# Patient Record
Sex: Female | Born: 1975 | Race: Black or African American | Hispanic: No | Marital: Single | Smoking: Former smoker
Health system: Southern US, Community
[De-identification: ages and names within clinical notes are randomized; demographics above are authoritative.]

## PROBLEM LIST (undated history)

## (undated) DIAGNOSIS — F32A Depression, unspecified: Secondary | ICD-10-CM

## (undated) DIAGNOSIS — I10 Essential (primary) hypertension: Secondary | ICD-10-CM

## (undated) DIAGNOSIS — E119 Type 2 diabetes mellitus without complications: Secondary | ICD-10-CM

## (undated) HISTORY — PX: BREAST ENHANCEMENT SURGERY: SHX7

## (undated) HISTORY — DX: Type 2 diabetes mellitus without complications: E11.9

## (undated) HISTORY — DX: Essential (primary) hypertension: I10

## (undated) HISTORY — PX: TUBAL LIGATION: SHX77

## (undated) HISTORY — DX: Depression, unspecified: F32.A

---

## 2020-01-29 ENCOUNTER — Ambulatory Visit (INDEPENDENT_AMBULATORY_CARE_PROVIDER_SITE_OTHER): Payer: No Typology Code available for payment source | Admitting: Obstetrics & Gynecology

## 2020-01-29 ENCOUNTER — Other Ambulatory Visit: Payer: Self-pay

## 2020-01-29 ENCOUNTER — Encounter: Payer: Self-pay | Admitting: *Deleted

## 2020-01-29 ENCOUNTER — Encounter: Payer: Self-pay | Admitting: Obstetrics & Gynecology

## 2020-01-29 ENCOUNTER — Other Ambulatory Visit (HOSPITAL_COMMUNITY)
Admission: RE | Admit: 2020-01-29 | Discharge: 2020-01-29 | Disposition: A | Discharge status: Home / Self Care | Payer: PRIVATE HEALTH INSURANCE | Source: Ambulatory Visit | Attending: Obstetrics & Gynecology | Admitting: Obstetrics & Gynecology

## 2020-01-29 DIAGNOSIS — N921 Excessive and frequent menstruation with irregular cycle: Secondary | ICD-10-CM

## 2020-01-29 DIAGNOSIS — D219 Benign neoplasm of connective and other soft tissue, unspecified: Secondary | ICD-10-CM

## 2020-01-29 LAB — POCT PREGNANCY, URINE: Preg Test, Ur: NEGATIVE

## 2020-01-29 NOTE — Addendum Note (Signed)
Addended by: Samuel Germany on: 01/29/2020 04:24 PM   Modules accepted: Orders

## 2020-01-29 NOTE — Progress Notes (Signed)
Colleen Lane is an 44 y.o. female. G1P1 who presents for heavy vaginal bleeding worsened over the past 3-4 years.  Pt now bleeding 4-5 days, using 12-15 pads/day. Passing clots.  Has declined medical management thus far, she had imaging done which noted fibroids, but unsure of size and location.   Pertinent Gynecological History: Menses: flow is excessive with use of 12 pads or tampons on heaviest days, irregular occurring approximately every 10 days without intermenstrual spotting and with severe dysmenorrhea Bleeding: dysfunctional uterine bleeding Contraception: tubal ligation DES exposure: denies Blood transfusions: none Sexually transmitted diseases: no past history Previous GYN Procedures: none  Last mammogram: normal Date: 2019 Last pap: normal Date: 2019 OB History: G1, P1001   Menstrual History: Menarche age: 63 Patient's last menstrual period was 01/13/2020 (within days).    Past Medical History:  Diagnosis Date  . Depression   . Diabetes mellitus without complication ([place])   . Hypertension     Past Surgical History:  Procedure Laterality Date  . BREAST ENHANCEMENT SURGERY    . TUBAL LIGATION      Family History  Problem Relation Age of Onset  . Hypertension Father   . Heart attack Father   . Diabetes Mother   . Hypertension Mother   . High Cholesterol Mother     Social History:  reports that she quit smoking about 17 years ago. Her smoking use included cigarettes. She has never used smokeless tobacco. She reports current alcohol use. She reports that she does not use drugs.  Allergies:  Allergies  Allergen Reactions  . Doxycycline Rash  . Sulfamethoxazole-Trimethoprim Rash    (Not in a hospital admission)   Review of Systems  Constitutional: Positive for fatigue. Negative for activity change, appetite change and unexpected weight change.  HENT: Negative.   Eyes: Negative.   Respiratory: Negative.  Negative for chest tightness, shortness of breath  and wheezing.   Cardiovascular: Negative.  Negative for chest pain and leg swelling.  Gastrointestinal: Negative.  Negative for abdominal distention, abdominal pain, constipation, diarrhea, nausea and vomiting.  Endocrine: Negative.   Genitourinary: Positive for menstrual problem and pelvic pain. Negative for dysuria and hematuria.  Neurological: Negative.  Negative for dizziness, weakness, light-headedness, numbness and headaches.  Hematological: Negative.   Psychiatric/Behavioral: Negative.  Negative for agitation, confusion and decreased concentration.    Blood pressure 127/77, pulse (!) 102, height 6' (1.829 m), weight 241 lb (109.3 kg), last menstrual period 01/13/2020. Physical Exam Vitals reviewed.  Constitutional:      Appearance: She is well-developed.  HENT:     Head: Normocephalic and atraumatic.  Eyes:     Pupils: Pupils are equal, round, and reactive to light.  Cardiovascular:     Rate and Rhythm: Normal rate.  Pulmonary:     Effort: Pulmonary effort is normal.  Abdominal:     General: There is no distension.     Palpations: Abdomen is soft.     Tenderness: There is no abdominal tenderness. There is no guarding.  Genitourinary:    General: Normal vulva.  Musculoskeletal:     Cervical back: Normal range of motion.  Skin:    General: Skin is warm and dry.  Neurological:     Mental Status: She is alert and oriented to person, place, and time.  Psychiatric:        Behavior: Behavior normal.     Results for orders placed or performed in visit on 01/29/20 (from the past 24 hour(s))  Pregnancy, urine POC  Status: None   Collection Time: 01/29/20  2:05 PM  Result Value Ref Range   Preg Test, Ur NEGATIVE NEGATIVE   ENDOMETRIAL BIOPSY     The indications for endometrial biopsy were reviewed.   Risks of the biopsy including cramping, bleeding, infection, uterine perforation, inadequate specimen and need for additional procedures  were discussed. The patient states  she understands and agrees to undergo procedure today. Consent was signed. Time out was performed. Urine HCG was negative. During the pelvic exam, the cervix was prepped with Betadine. A single-toothed tenaculum was placed on the anterior lip of the cervix to stabilize it. The 3 mm pipelle was introduced into the endometrial cavity without difficulty to a depth of 8.5 cm, and a moderate amount of tissue was obtained and sent to pathology. The instruments were removed from the patient's vagina. Minimal bleeding from the cervix was noted. The patient tolerated the procedure well. Routine post-procedure instructions were given to the patient.     Assessment/Plan: 44yo G1P1 presents with chronic menorrhagia with irregular bleeding. We proceeded with EMB today, pelvic U/S ordered to assess for potential fibroids. No prior imaging was noted on chart review.  Discussed starting provera or another hormonal regimen to decrease bleeding.  Pt desires the results before starting on hormone therapy.  Precautions given, follow up after results, prn concerns.   [name] 01/29/2020, 2:59 PM

## 2020-01-29 NOTE — Patient Instructions (Signed)
Endometrial Biopsy  Endometrial biopsy is a procedure in which a tissue sample is taken from inside the uterus. The sample is taken from the endometrium, which is the lining of the uterus. The tissue sample is then checked under a microscope to see if the tissue is normal or abnormal. This procedure helps to determine where you are in your menstrual cycle and how hormone levels are affecting the lining of the uterus. This procedure may also be used to evaluate uterine bleeding or to diagnose endometrial cancer, endometrial tuberculosis, polyps, or other inflammatory conditions. Tell a health care provider about:  Any allergies you have.  All medicines you are taking, including vitamins, herbs, eye drops, creams, and over-the-counter medicines.  Any problems you or family members have had with anesthetic medicines.  Any blood disorders you have.  Any surgeries you have had.  Any medical conditions you have.  Whether you are pregnant or may be pregnant. What are the risks? Generally, this is a safe procedure. However, problems may occur, including:  Bleeding.  Pelvic infection.  Puncture of the wall of the uterus with the biopsy device (rare). What happens before the procedure?  Keep a record of your menstrual cycles as told by your health care provider. You may need to schedule your procedure for a specific time in your cycle.  You may want to bring a sanitary pad to wear after the procedure.  Ask your health care provider about: ? Changing or stopping your regular medicines. This is especially important if you are taking diabetes medicines or blood thinners. ? Taking medicines such as aspirin and ibuprofen. These medicines can thin your blood. Do not take these medicines before your procedure if your health care provider instructs you not to.  Plan to have someone take you home from the hospital or clinic. What happens during the procedure?  To lower your risk of  infection: ? Your health care team will wash or sanitize their hands.  You will lie on an exam table with your feet and legs supported as in a pelvic exam.  Your health care provider will insert an instrument (speculum) into your vagina to see your cervix.  Your cervix will be cleansed with an antiseptic solution.  A medicine (local anesthetic) will be used to numb the cervix.  A forceps instrument (tenaculum) will be used to hold your cervix steady for the biopsy.  A thin, rod-like instrument (uterine sound) will be inserted through your cervix to determine the length of your uterus and the location where the biopsy sample will be removed.  A thin, flexible tube (catheter) will be inserted through your cervix and into the uterus. The catheter will be used to collect the biopsy sample from your endometrial tissue.  The catheter and speculum will then be removed, and the tissue sample will be sent to a lab for examination. What happens after the procedure?  You will rest in a recovery area until you are ready to go home.  You may have mild cramping and a small amount of vaginal bleeding. This is normal.  It is up to you to get the results of your procedure. Ask your health care provider, or the department that is doing the procedure, when your results will be ready. Summary  Endometrial biopsy is a procedure in which a tissue sample is taken from the endometrium, which is the lining of the uterus.  This procedure may help to diagnose menstrual cycle problems, abnormal bleeding, or other conditions affecting   the endometrium.  Before the procedure, keep a record of your menstrual cycles as told by your health care provider.  The tissue sample that is removed will be checked under a microscope to see if it is normal or abnormal. This information is not intended to replace advice given to you by your health care provider. Make sure you discuss any questions you have with your health care  provider. Document Revised: 05/19/2017 Document Reviewed: 06/22/2016 Elsevier Patient Education  2020 [employer] Inc[employer]  

## 2020-01-31 LAB — SURGICAL PATHOLOGY

## 2020-02-04 ENCOUNTER — Ambulatory Visit
Admission: RE | Admit: 2020-02-04 | Discharge: 2020-02-04 | Disposition: A | Discharge status: Home / Self Care | Payer: PRIVATE HEALTH INSURANCE | Source: Ambulatory Visit | Attending: Obstetrics & Gynecology | Admitting: Obstetrics & Gynecology

## 2020-02-04 ENCOUNTER — Other Ambulatory Visit: Payer: Self-pay

## 2020-02-04 ENCOUNTER — Other Ambulatory Visit: Payer: Self-pay | Admitting: Obstetrics & Gynecology

## 2020-02-04 DIAGNOSIS — D219 Benign neoplasm of connective and other soft tissue, unspecified: Secondary | ICD-10-CM | POA: Insufficient documentation

## 2020-02-04 DIAGNOSIS — N921 Excessive and frequent menstruation with irregular cycle: Secondary | ICD-10-CM

## 2020-02-17 ENCOUNTER — Telehealth (INDEPENDENT_AMBULATORY_CARE_PROVIDER_SITE_OTHER): Payer: No Typology Code available for payment source

## 2020-02-17 DIAGNOSIS — N921 Excessive and frequent menstruation with irregular cycle: Secondary | ICD-10-CM

## 2020-02-17 NOTE — Telephone Encounter (Addendum)
-----   Message from [name], MD sent at 02/14/2020  2:30 AM EDT ----- EMB neg, U/S noted multiple fibroids some submucosal.  Okay to start hormonal therapy.  We can also discuss Ablation vs hysterectomy.    Called pt; negative endometrial biopsy result given. [place] results given. Explained that pt can talk in detail with provider at appt on 02/27/20. Pt is still interested in surgery but is concerned about how COVID may affect her options. Encouraged pt to follow up with office as needed prior to appt.

## 2020-02-20 ENCOUNTER — Ambulatory Visit: Payer: No Typology Code available for payment source

## 2020-02-27 ENCOUNTER — Ambulatory Visit (INDEPENDENT_AMBULATORY_CARE_PROVIDER_SITE_OTHER): Payer: No Typology Code available for payment source | Admitting: Obstetrics & Gynecology

## 2020-02-27 ENCOUNTER — Other Ambulatory Visit: Payer: Self-pay

## 2020-02-27 ENCOUNTER — Encounter: Payer: Self-pay | Admitting: Obstetrics & Gynecology

## 2020-02-27 VITALS — BP 139/83 | HR 79 | Wt 217.0 lb

## 2020-02-27 DIAGNOSIS — D219 Benign neoplasm of connective and other soft tissue, unspecified: Secondary | ICD-10-CM

## 2020-02-27 DIAGNOSIS — N921 Excessive and frequent menstruation with irregular cycle: Secondary | ICD-10-CM | POA: Diagnosis not present

## 2020-02-27 NOTE — Progress Notes (Signed)
Colleen Lane is an 44 y.o. female. G1P1 who presents for U/S result follow up. Pt still with monthly heavy cycles. After discussion on risks and benefits to hormonal therapy, declines hormones.  Pt interested in novasure ablation vs hysterectomy   Pertinent Gynecological History: Menses: regular every 5-7 days without intermenstrual spotting Bleeding: dysfunctional uterine bleeding Contraception: tubal ligation DES exposure: denies Blood transfusions: none Sexually transmitted diseases: no past history OB History: G1, P1001   Menstrual History: Menarche age: 15 No LMP recorded.    Past Medical History:  Diagnosis Date  . Depression   . Diabetes mellitus without complication ([place])   . Hypertension     Past Surgical History:  Procedure Laterality Date  . BREAST ENHANCEMENT SURGERY    . TUBAL LIGATION      Family History  Problem Relation Age of Onset  . Hypertension Father   . Heart attack Father   . Diabetes Mother   . Hypertension Mother   . High Cholesterol Mother     Social History:  reports that she quit smoking about 17 years ago. Her smoking use included cigarettes. She has never used smokeless tobacco. She reports current alcohol use. She reports that she does not use drugs.  Allergies:  Allergies  Allergen Reactions  . Doxycycline Rash  . Sulfamethoxazole-Trimethoprim Rash    (Not in a hospital admission)   Review of Systems  Constitutional: Negative for activity change, appetite change, fatigue and unexpected weight change.  HENT: Negative.   Eyes: Negative.   Respiratory: Negative.  Negative for chest tightness, shortness of breath and wheezing.   Cardiovascular: Negative.  Negative for chest pain and leg swelling.  Gastrointestinal: Negative.  Negative for abdominal distention, abdominal pain, constipation, diarrhea, nausea and vomiting.  Endocrine: Negative.   Genitourinary: Negative.  Negative for dysuria and hematuria.  Neurological:  Negative.  Negative for dizziness, weakness, light-headedness, numbness and headaches.  Hematological: Negative.   Psychiatric/Behavioral: Negative.  Negative for agitation, confusion and decreased concentration.    Blood pressure 139/83, pulse 79, weight 217 lb (98.4 kg). Physical Exam Vitals reviewed.  Constitutional:      Appearance: She is well-developed.  HENT:     Head: Normocephalic and atraumatic.  Eyes:     Pupils: Pupils are equal, round, and reactive to light.  Neck:     Vascular: No carotid bruit.  Cardiovascular:     Rate and Rhythm: Normal rate.  Pulmonary:     Effort: Pulmonary effort is normal.  Abdominal:     General: There is no distension.     Palpations: Abdomen is soft.     Tenderness: There is no abdominal tenderness. There is no guarding.  Musculoskeletal:     Cervical back: Normal range of motion.  Skin:    General: Skin is warm and dry.  Neurological:     Mental Status: She is alert and oriented to person, place, and time.  Psychiatric:        Behavior: Behavior normal.     No results found for this or any previous visit (from the past 24 hour(s)).  No results found.  Assessment/Plan: 44yo G1P1 who presents s/p TVUSG for chronic menorrhagia and dysmenorrhea. U/S noted fibroids, not invading into uterine cavity. Pt declines hormonal management. Pt interested in Novasure endometrial ablation, information given. Pt to make preop appt if patient intersted.   [name] 02/27/2020, 1:44 PM

## 2020-02-27 NOTE — Patient Instructions (Signed)
Endometrial Ablation  Endometrial ablation is a procedure that destroys the thin inner layer of the lining of the uterus (endometrium). This procedure may be done:  To stop heavy periods.  To stop bleeding that is causing anemia.  To control irregular bleeding.  To treat bleeding caused by small tumors (fibroids) in the endometrium.  This procedure is often an alternative to major surgery, such as removal of the uterus and cervix (hysterectomy). As a result of this procedure:  You may not be able to have children. However, if you are premenopausal (you have not gone through menopause):  You may still have a small chance of getting pregnant.  You will need to use a reliable method of birth control after the procedure to prevent pregnancy.  You may stop having a menstrual period, or you may have only a small amount of bleeding during your period. Menstruation may return several years after the procedure.  Tell a health care provider about:  Any allergies you have.  All medicines you are taking, including vitamins, herbs, eye drops, creams, and over-the-counter medicines.  Any problems you or family members have had with the use of anesthetic medicines.  Any blood disorders you have.  Any surgeries you have had.  Any medical conditions you have.  What are the risks?  Generally, this is a safe procedure. However, problems may occur, including:  A hole (perforation) in the uterus or bowel.  Infection of the uterus, bladder, or vagina.  Bleeding.  Damage to other structures or organs.  An air bubble in the lung (air embolus).  Problems with pregnancy after the procedure.  Failure of the procedure.  Decreased ability to diagnose cancer in the endometrium.  What happens before the procedure?  You will have tests of your endometrium to make sure there are no pre-cancerous cells or cancer cells present.  You may have an ultrasound of the uterus.  You may be given medicines to thin the endometrium.  Ask your health care  provider about:  Changing or stopping your regular medicines. This is especially important if you take diabetes medicines or blood thinners.  Taking medicines such as aspirin and ibuprofen. These medicines can thin your blood. Do not take these medicines before your procedure if your doctor tells you not to.  Plan to have someone take you home from the hospital or clinic.  What happens during the procedure?    You will lie on an exam table with your feet and legs supported as in a pelvic exam.  To lower your risk of infection:  Your health care team will wash or sanitize their hands and put on germ-free (sterile) gloves.  Your genital area will be washed with soap.  An IV tube will be inserted into one of your veins.  You will be given a medicine to help you relax (sedative).  A surgical instrument with a light and camera (resectoscope) will be inserted into your vagina and moved into your uterus. This allows your surgeon to see inside your uterus.  Endometrial tissue will be removed using one of the following methods:  Radiofrequency. This method uses a radiofrequency-alternating electric current to remove the endometrium.  Cryotherapy. This method uses extreme cold to freeze the endometrium.  Heated-free liquid. This method uses a heated saltwater (saline) solution to remove the endometrium.  Microwave. This method uses high-energy microwaves to heat up the endometrium and remove it.  Thermal balloon. This method involves inserting a catheter with a balloon tip into   the uterus. The balloon tip is filled with heated fluid to remove the endometrium.  The procedure may vary among health care providers and hospitals.  What happens after the procedure?  Your blood pressure, heart rate, breathing rate, and blood oxygen level will be monitored until the medicines you were given have worn off.  As tissue healing occurs, you may notice vaginal bleeding for 4-6 weeks after the procedure. You may also  experience:  Cramps.  Thin, watery vaginal discharge that is light pink or brown in color.  A need to urinate more frequently than usual.  Nausea.  Do not drive for 24 hours if you were given a sedative.  Do not have sex or insert anything into your vagina until your health care provider approves.  Summary  Endometrial ablation is done to treat the many causes of heavy menstrual bleeding.  The procedure may be done only after medications have been tried to control the bleeding.  Plan to have someone take you home from the hospital or clinic.  This information is not intended to replace advice given to you by your health care provider. Make sure you discuss any questions you have with your health care provider.  Document Revised: 11/21/2017 Document Reviewed: 06/23/2016  Elsevier Patient Education  2020 [employer] Inc[employer]

## 2021-11-21 IMAGING — US US PELVIS COMPLETE WITH TRANSVAGINAL
1 series · 15 of 25 positions shown · non-contrast
Comparison: None

CLINICAL DATA: Menorrhagia for 3-4 years, history of uterine
fibroids, LMP 01/13/2020; history hypertension, diabetes mellitus,
obesity



[Series 1: us pelvis complete with transvaginal · 15 of 82 slices shown]
[im 1/82]
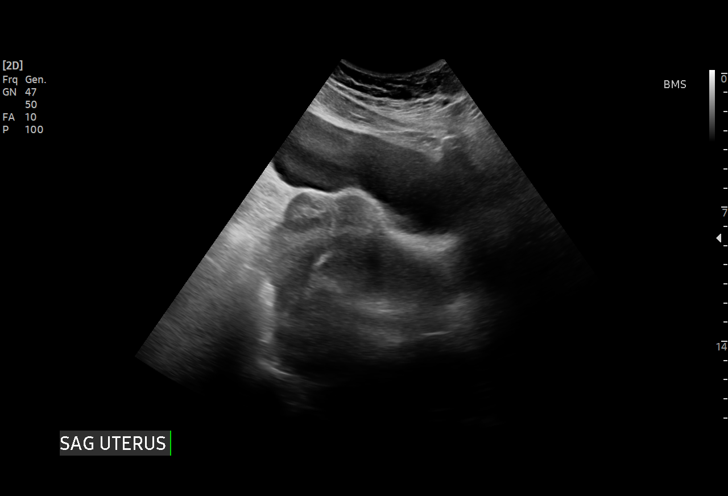
[im 7/82]
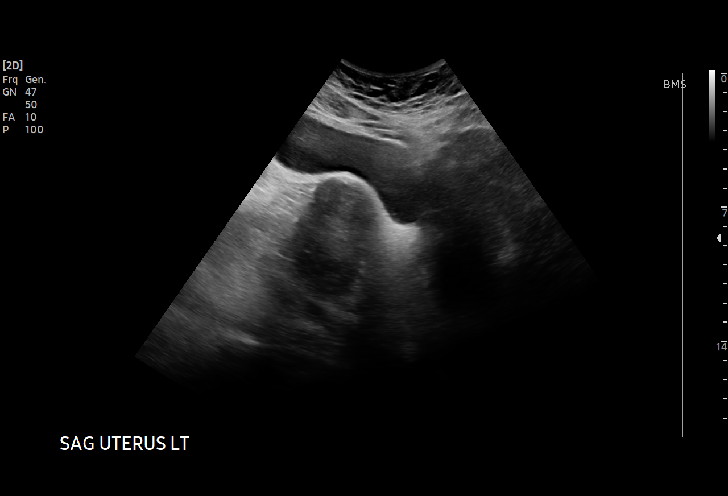
[im 14/82]
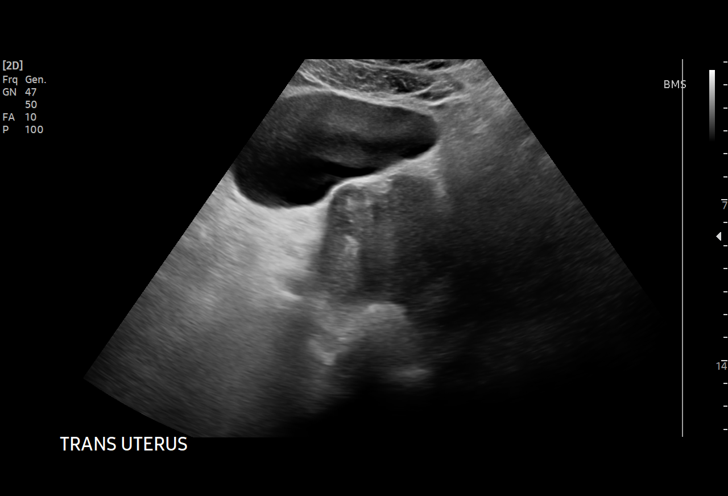
[im 17/82]
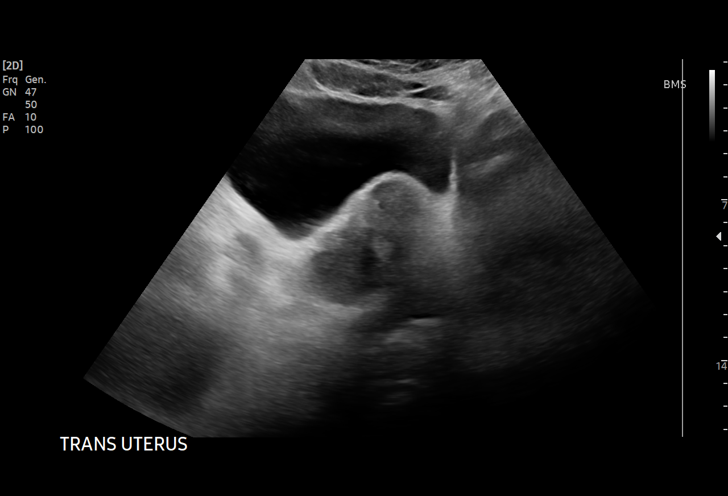
[im 24/82]
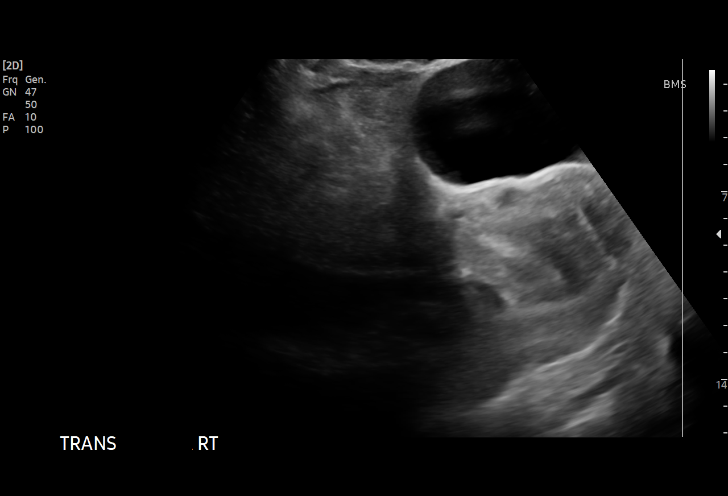
[im 31/82]
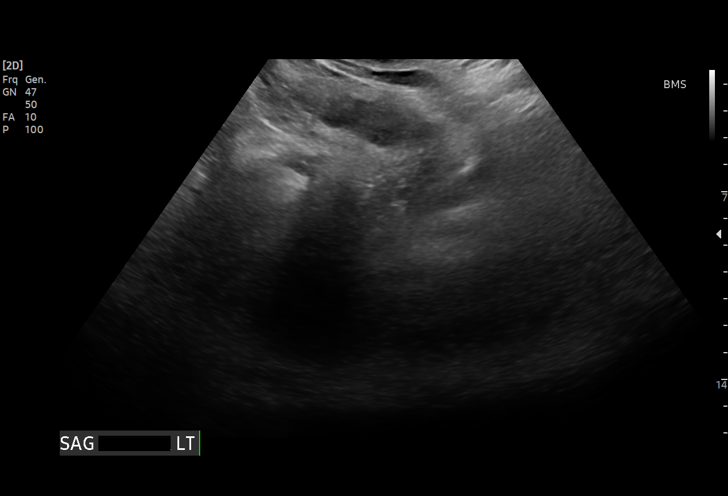
[im 34/82]
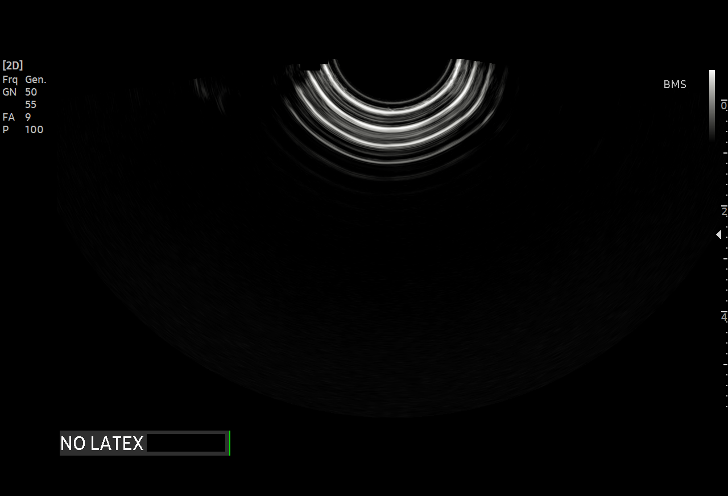
[im 41/82]
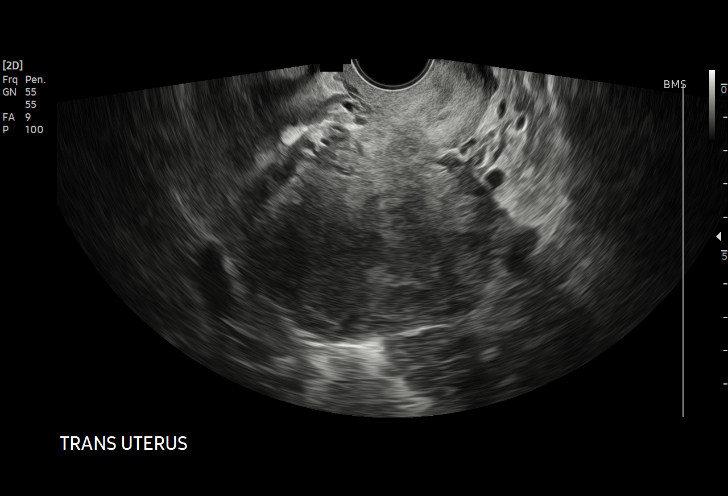
[im 48/82]
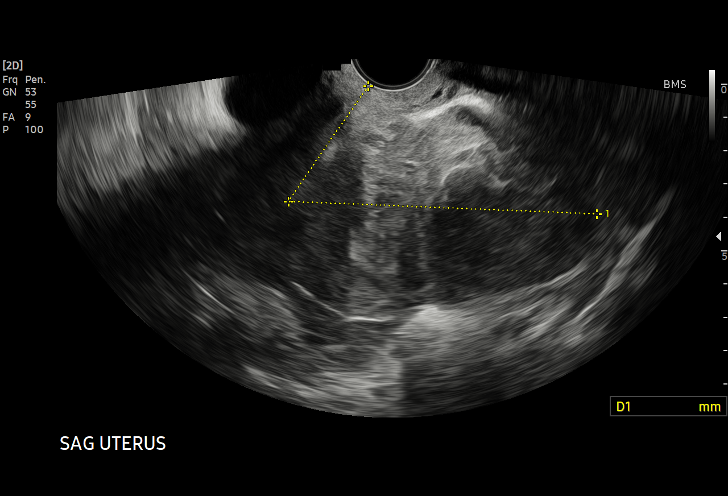
[im 51/82]
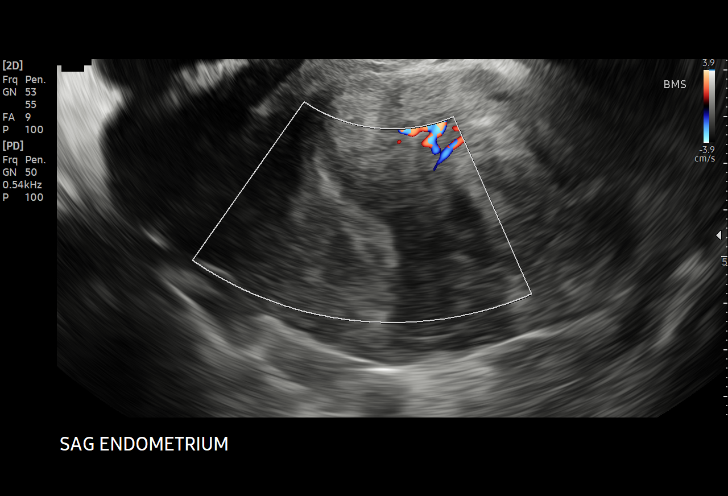
[im 58/82]
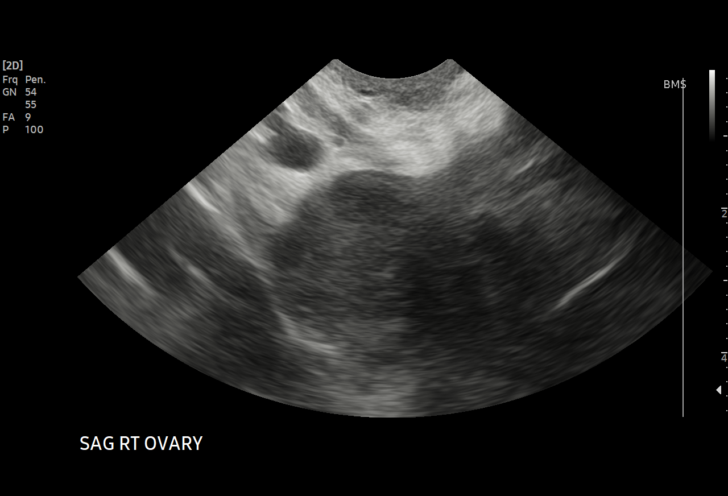
[im 65/82]
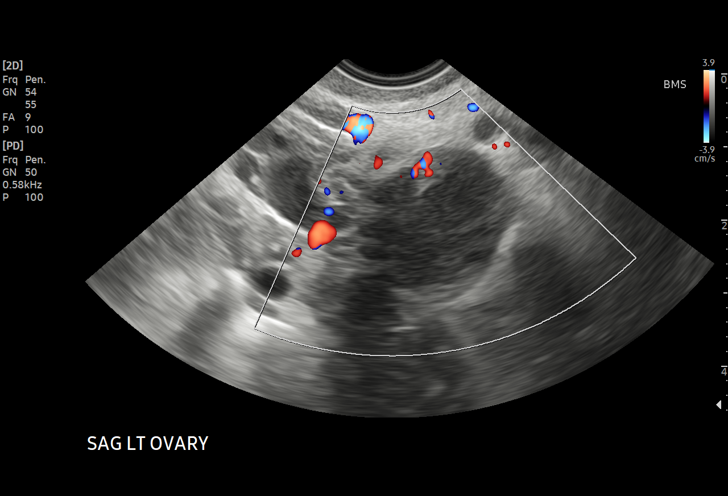
[im 68/82]
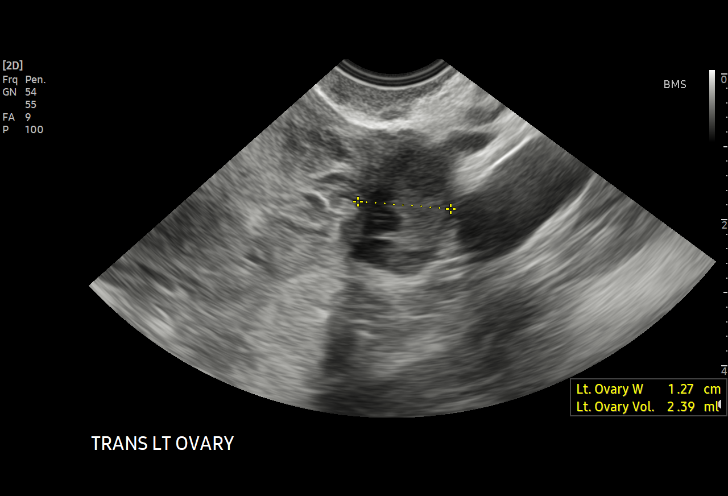
[im 75/82]
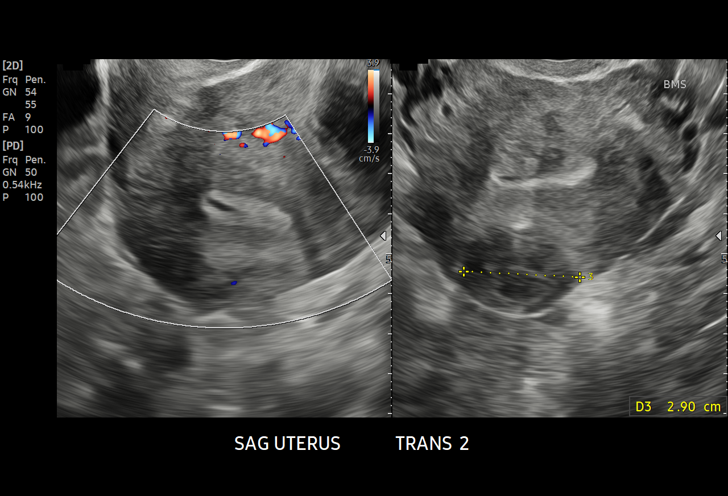
[im 82/82]
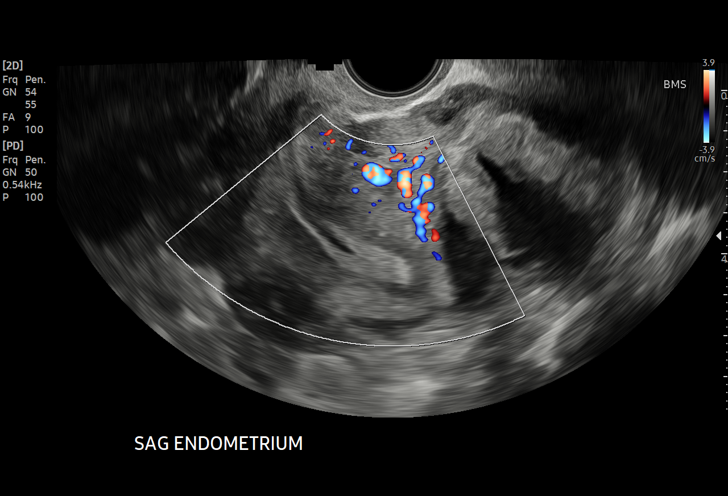

[15 of 25 positions shown; findings below may reference images not displayed]

FINDINGS: Uterus

Measurements: 5.0 x 5.9 x 7.8 cm = volume: 217 mL. Enlarged,
heterogeneous, and nodular in appearance consistent with multiple
uterine leiomyomata. Retroverted. Leiomyomata measure up to 3.4 cm
at upper uterine segment, 3.3 cm at posteriorly, and 4.3 cm at RIGHT
lateral uterus. These range from subserosal to submucosal.

Endometrium

Thickness: 8 mm.  Tiny amount of endometrial fluid.  No focal mass.

Right ovary

Measurements: 2.1 x 1.9 x 1.5 cm = volume: 3.0 mL. Normal morphology
without mass

Left ovary

Measurements: 2.3 x 1.6 x 1.3 cm = volume: 2.4 mL. Normal morphology
without mass

Other findings

No free pelvic fluid.  No adnexal masses.
IMPRESSION: Enlarged uterus containing multiple leiomyomata, largest 4.3 cm
diameter.

Unremarkable endometrial complex and ovaries.
